# Patient Record
Sex: Male | Born: 1990 | Race: Black or African American | Hispanic: No | Marital: Single | Smoking: Never smoker
Health system: Southern US, Community
[De-identification: ages and names within clinical notes are randomized; demographics above are authoritative.]

---

## 2014-11-21 ENCOUNTER — Emergency Department (HOSPITAL_COMMUNITY): Payer: No Typology Code available for payment source

## 2014-11-21 ENCOUNTER — Emergency Department (HOSPITAL_COMMUNITY)
Admission: EM | Admit: 2014-11-21 | Discharge: 2014-11-21 | Disposition: A | Discharge status: Home / Self Care | Payer: No Typology Code available for payment source | Attending: Emergency Medicine | Admitting: Emergency Medicine

## 2014-11-21 ENCOUNTER — Encounter (HOSPITAL_COMMUNITY): Payer: Self-pay | Admitting: Physical Medicine and Rehabilitation

## 2014-11-21 DIAGNOSIS — M79606 Pain in leg, unspecified: Secondary | ICD-10-CM

## 2014-11-21 DIAGNOSIS — M25561 Pain in right knee: Secondary | ICD-10-CM

## 2014-11-21 DIAGNOSIS — Y998 Other external cause status: Secondary | ICD-10-CM | POA: Insufficient documentation

## 2014-11-21 DIAGNOSIS — Y92410 Unspecified street and highway as the place of occurrence of the external cause: Secondary | ICD-10-CM | POA: Insufficient documentation

## 2014-11-21 DIAGNOSIS — S8991XA Unspecified injury of right lower leg, initial encounter: Secondary | ICD-10-CM | POA: Insufficient documentation

## 2014-11-21 DIAGNOSIS — Y9389 Activity, other specified: Secondary | ICD-10-CM | POA: Insufficient documentation

## 2014-11-21 MED ORDER — OXYCODONE-ACETAMINOPHEN 5-325 MG PO TABS: 1.0000 | ORAL_TABLET | Freq: Four times a day (QID) | ORAL | Status: AC | PRN
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
